# Patient Record
Sex: Male | Born: 2000 | Hispanic: No | Marital: Single | Smoking: Never smoker
Health system: Southern US, Community
[De-identification: ages and names within clinical notes are randomized; demographics above are authoritative.]

## PROBLEM LIST (undated history)

## (undated) ENCOUNTER — Emergency Department (HOSPITAL_COMMUNITY): Discharge status: Against Medical Advice | Payer: Medicaid Other

---
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
## 2022-06-05 ENCOUNTER — Ambulatory Visit (HOSPITAL_COMMUNITY)
Admission: EM | Admit: 2022-06-05 | Discharge: 2022-06-05 | Disposition: A | Discharge status: Home / Self Care | Payer: Medicaid Other | Attending: Family Medicine | Admitting: Family Medicine

## 2022-06-05 ENCOUNTER — Encounter (HOSPITAL_COMMUNITY): Payer: Self-pay

## 2022-06-05 DIAGNOSIS — R0600 Dyspnea, unspecified: Secondary | ICD-10-CM | POA: Diagnosis not present

## 2022-06-05 DIAGNOSIS — R110 Nausea: Secondary | ICD-10-CM | POA: Diagnosis not present

## 2022-06-05 MED ORDER — ONDANSETRON 4 MG PO TBDP: 4.0000 mg | ORAL_TABLET | Freq: Three times a day (TID) | ORAL | 0 refills | Status: DC | PRN

## 2022-06-05 NOTE — ED Triage Notes (Signed)
Pt feels nausea, SOB, since today

## 2022-06-05 NOTE — Discharge Instructions (Signed)
Ondansetron dissolved in the mouth every 8 hours as needed for nausea or vomiting. Clear liquids and bland things to eat.  You can use the QR code/website at the back of the summary paperwork to schedule yourself a new patient appointment with primary care

## 2022-06-05 NOTE — ED Provider Notes (Signed)
[facility]    CSN: [id number] Arrival date & time: 06/05/22  1634      History   Chief Complaint Chief Complaint  Patient presents with   Nausea    HPI Vernon Gray is a 21 y.o. male.   HPI Here for nausea that began at 1 this afternoon.  He has had no vomiting or diarrhea today.  His last normal bowel movement was this morning.  No blood  No fever or chills or upper respiratory symptoms.  He states he is also short of breath.  He states he has been short of breath since he was 17, that means for years.  It does come and go  He does not have a primary care provider  History reviewed. No pertinent past medical history.  There are no problems to display for this patient.   Past Surgical History:  Procedure Laterality Date   ADENOIDECTOMY Bilateral 01/16/2013   TONSILLECTOMY Bilateral 01/16/2013   TONSILLECTOMY AND ADENOIDECTOMY Bilateral 01/16/2013   Procedure: TONSILLECTOMY AND ADENOIDECTOMY;  Surgeon: [name], MD;  Location: [facility] OR;  Service: ENT;  Laterality: Bilateral;       Home Medications    Prior to Admission medications   Medication Sig Start Date End Date Taking? Authorizing Provider  ondansetron (ZOFRAN-ODT) 4 MG disintegrating tablet Take 1 tablet (4 mg total) by mouth every 8 (eight) hours as needed for nausea or vomiting. 06/05/22  Yes Zenia Resides, MD  cetirizine HCl (ZYRTEC) 5 MG/5ML SYRP Take 5 mLs (5 mg total) by mouth daily. 02/06/14   Ozella Rocks, MD  fluticasone (FLONASE) 50 MCG/ACT nasal spray Place 1 spray into both nostrils daily. 02/06/14   Ozella Rocks, MD  Olopatadine HCl 0.2 % SOLN Apply 1 drop to eye daily. 02/06/14   Ozella Rocks, MD    Family History Family History  Problem Relation Age of Onset   Hypertension Paternal Grandmother     Social History Social History   Tobacco Use   Smoking status: Never  Substance Use Topics   Alcohol use: No   Drug use: No     Allergies    Patient has no known allergies.   Review of Systems Review of Systems   Physical Exam Triage Vital Signs ED Triage Vitals  Enc Vitals Group     BP 06/05/22 1759 102/60     Pulse Rate 06/05/22 1759 72     Resp 06/05/22 1759 16     Temp 06/05/22 1759 98.8 F (37.1 C)     Temp Source 06/05/22 1759 Oral     SpO2 06/05/22 1759 99 %     Weight --      Height --      Head Circumference --      Peak Flow --      Pain Score 06/05/22 1803 0     Pain Loc --      Pain Edu? --      Excl. in GC? --    No data found.  Updated Vital Signs BP 102/60   Pulse 72   Temp 98.8 F (37.1 C) (Oral)   Resp 16   SpO2 99%   Visual Acuity Right Eye Distance:   Left Eye Distance:   Bilateral Distance:    Right Eye Near:   Left Eye Near:    Bilateral Near:     Physical Exam Vitals reviewed.  Constitutional:      General: He is not in acute  distress.    Appearance: He is not toxic-appearing.  HENT:     Right Ear: Tympanic membrane and ear canal normal.     Left Ear: Tympanic membrane and ear canal normal.     Nose: Nose normal.     Mouth/Throat:     Mouth: Mucous membranes are moist.     Pharynx: No oropharyngeal exudate or posterior oropharyngeal erythema.  Eyes:     Extraocular Movements: Extraocular movements intact.     Conjunctiva/sclera: Conjunctivae normal.     Pupils: Pupils are equal, round, and reactive to light.  Cardiovascular:     Rate and Rhythm: Normal rate and regular rhythm.     Heart sounds: No murmur heard. Pulmonary:     Effort: Pulmonary effort is normal. No respiratory distress.     Breath sounds: Normal breath sounds. No stridor. No wheezing, rhonchi or rales.  Abdominal:     Palpations: Abdomen is soft.     Tenderness: There is no abdominal tenderness.  Musculoskeletal:     Cervical back: Neck supple.  Lymphadenopathy:     Cervical: No cervical adenopathy.  Skin:    Capillary Refill: Capillary refill takes less than 2 seconds.     Coloration:  Skin is not jaundiced or pale.  Neurological:     General: No focal deficit present.     Mental Status: He is alert and oriented to person, place, and time.  Psychiatric:        Behavior: Behavior normal.      UC Treatments / Results  Labs (all labs ordered are listed, but only abnormal results are displayed) Labs Reviewed - No data to display  EKG   Radiology No results found.  Procedures Procedures (including critical care time)  Medications Ordered in UC Medications - No data to display  Initial Impression / Assessment and Plan / UC Course  I have reviewed the triage vital signs and the nursing notes.  Pertinent labs & imaging results that were available during my care of the patient were reviewed by me and considered in my medical decision making (see chart for details).        I feel the nausea is most likely due to a new viral gastroenteritis.  Zofran sent.  His lungs are clear and his vital signs are reassuring.  I have asked for assistance finding him in PCP for further evaluation of this intermittent chronic shortness of breath Final Clinical Impressions(s) / UC Diagnoses   Final diagnoses:  Nausea  Dyspnea, unspecified type     Discharge Instructions      Ondansetron dissolved in the mouth every 8 hours as needed for nausea or vomiting. Clear liquids and bland things to eat.  You can use the QR code/website at the back of the summary paperwork to schedule yourself a new patient appointment with primary care       ED Prescriptions     Medication Sig Dispense Auth. Provider   ondansetron (ZOFRAN-ODT) 4 MG disintegrating tablet Take 1 tablet (4 mg total) by mouth every 8 (eight) hours as needed for nausea or vomiting. 10 tablet Marlinda Mike Janace Aris, MD      PDMP not reviewed this encounter.   Zenia Resides, MD 06/05/22 [phone]

## 2023-03-01 ENCOUNTER — Ambulatory Visit (HOSPITAL_COMMUNITY)
Admission: EM | Admit: 2023-03-01 | Discharge: 2023-03-01 | Disposition: A | Discharge status: Home / Self Care | Payer: MEDICAID | Attending: Emergency Medicine | Admitting: Emergency Medicine

## 2023-03-01 ENCOUNTER — Encounter (HOSPITAL_COMMUNITY): Payer: Self-pay

## 2023-03-01 DIAGNOSIS — U071 COVID-19: Secondary | ICD-10-CM | POA: Diagnosis not present

## 2023-03-01 DIAGNOSIS — J029 Acute pharyngitis, unspecified: Secondary | ICD-10-CM

## 2023-03-01 DIAGNOSIS — J069 Acute upper respiratory infection, unspecified: Secondary | ICD-10-CM | POA: Diagnosis present

## 2023-03-01 DIAGNOSIS — R059 Cough, unspecified: Secondary | ICD-10-CM | POA: Insufficient documentation

## 2023-03-01 DIAGNOSIS — B9789 Other viral agents as the cause of diseases classified elsewhere: Secondary | ICD-10-CM | POA: Diagnosis not present

## 2023-03-01 LAB — POCT RAPID STREP A (OFFICE): Rapid Strep A Screen: NEGATIVE

## 2023-03-01 LAB — SARS CORONAVIRUS 2 (TAT 6-24 HRS): SARS Coronavirus 2: POSITIVE — AB

## 2023-03-01 MED ORDER — BENZONATATE 100 MG PO CAPS: 100.0000 mg | ORAL_CAPSULE | Freq: Three times a day (TID) | ORAL | 0 refills | Status: DC

## 2023-03-01 MED ORDER — IBUPROFEN 800 MG PO TABS: 800.0000 mg | ORAL_TABLET | Freq: Three times a day (TID) | ORAL | 0 refills | Status: DC

## 2023-03-01 MED ORDER — ACETAMINOPHEN 500 MG PO TABS: 500.0000 mg | ORAL_TABLET | Freq: Four times a day (QID) | ORAL | 0 refills | Status: DC | PRN

## 2023-03-01 NOTE — ED Triage Notes (Signed)
Pt c/o sore throat, headache, fever, and nasal congestion x4 days. Taking OTC with little relief.

## 2023-03-01 NOTE — ED Provider Notes (Signed)
[facility]    CSN: [id number] Arrival date & time: 03/01/23  1021      History   Chief Complaint Chief Complaint  Patient presents with   Sore Throat    HPI Vernon Gray is a 22 y.o. male.   Patient presents to clinic with sore throat, subjective fever, headache, nasal congestion and bodyaches that have been present for the past 4 days.  Has not measured his temperature with a thermometer.  He has been around someone with similar symptoms.  He denies any abdominal pain, nausea, vomiting or diarrhea.  Does have a dry cough with mucus.  Reports feeling congested when he takes a deep breath.  Denies any wheezing, chest pain or shortness of breath.  He has been taking Tylenol and Mucinex.  He has had his tonsils and adenoids removed.    The history is provided by the patient and medical records.  Sore Throat Pertinent negatives include no chest pain, no abdominal pain and no shortness of breath.    History reviewed. No pertinent past medical history.  There are no problems to display for this patient.   Past Surgical History:  Procedure Laterality Date   ADENOIDECTOMY Bilateral 01/16/2013   TONSILLECTOMY Bilateral 01/16/2013   TONSILLECTOMY AND ADENOIDECTOMY Bilateral 01/16/2013   Procedure: TONSILLECTOMY AND ADENOIDECTOMY;  Surgeon: [name], MD;  Location: [facility] OR;  Service: ENT;  Laterality: Bilateral;       Home Medications    Prior to Admission medications   Medication Sig Start Date End Date Taking? Authorizing Provider  acetaminophen (TYLENOL) 500 MG tablet Take 1 tablet (500 mg total) by mouth every 6 (six) hours as needed. 03/01/23  Yes Rinaldo Ratel, [place] N, FNP  benzonatate (TESSALON) 100 MG capsule Take 1 capsule (100 mg total) by mouth every 8 (eight) hours. 03/01/23  Yes Rinaldo Ratel, [place] N, FNP  ibuprofen (ADVIL) 800 MG tablet Take 1 tablet (800 mg total) by mouth 3 (three) times daily. 03/01/23  Yes Rinaldo Ratel, [place] N, FNP   cetirizine HCl (ZYRTEC) 5 MG/5ML SYRP Take 5 mLs (5 mg total) by mouth daily. 02/06/14   Ozella Rocks, MD  fluticasone (FLONASE) 50 MCG/ACT nasal spray Place 1 spray into both nostrils daily. 02/06/14   Ozella Rocks, MD  Olopatadine HCl 0.2 % SOLN Apply 1 drop to eye daily. 02/06/14   Ozella Rocks, MD  ondansetron (ZOFRAN-ODT) 4 MG disintegrating tablet Take 1 tablet (4 mg total) by mouth every 8 (eight) hours as needed for nausea or vomiting. 06/05/22   Zenia Resides, MD    Family History Family History  Problem Relation Age of Onset   Hypertension Paternal Grandmother     Social History Social History   Tobacco Use   Smoking status: Never  Vaping Use   Vaping status: Every Day  Substance Use Topics   Alcohol use: No   Drug use: Yes    Types: Marijuana     Allergies   Patient has no known allergies.   Review of Systems Review of Systems  Constitutional:  Positive for fever.  HENT:  Positive for congestion and sore throat.   Respiratory:  Positive for cough. Negative for shortness of breath and wheezing.   Cardiovascular:  Negative for chest pain.  Gastrointestinal:  Negative for abdominal pain, diarrhea, nausea and vomiting.     Physical Exam Triage Vital Signs ED Triage Vitals [03/01/23 1137]  Encounter Vitals Group     BP 128/74     Systolic  BP Percentile      Diastolic BP Percentile      Pulse Rate 71     Resp 18     Temp 98.6 F (37 C)     Temp Source Oral     SpO2 98 %     Weight      Height      Head Circumference      Peak Flow      Pain Score 7     Pain Loc      Pain Education      Exclude from Growth Chart    No data found.  Updated Vital Signs BP 128/74 (BP Location: Left Arm)   Pulse 71   Temp 98.6 F (37 C) (Oral)   Resp 18   SpO2 98%   Visual Acuity Right Eye Distance:   Left Eye Distance:   Bilateral Distance:    Right Eye Near:   Left Eye Near:    Bilateral Near:     Physical Exam Vitals and nursing note  reviewed.  Constitutional:      Appearance: Normal appearance. He is well-developed.  HENT:     Head: Normocephalic and atraumatic.     Right Ear: External ear normal.     Left Ear: External ear normal.     Nose: Congestion and rhinorrhea present.     Mouth/Throat:     Mouth: Mucous membranes are moist.     Pharynx: Uvula midline. Posterior oropharyngeal erythema present.     Tonsils: No tonsillar exudate or tonsillar abscesses.  Eyes:     Conjunctiva/sclera: Conjunctivae normal.  Cardiovascular:     Rate and Rhythm: Normal rate and regular rhythm.     Heart sounds: Normal heart sounds. No murmur heard. Pulmonary:     Effort: Pulmonary effort is normal. No respiratory distress.     Breath sounds: Normal breath sounds.  Musculoskeletal:     Cervical back: Normal range of motion.  Skin:    General: Skin is warm and dry.  Neurological:     General: No focal deficit present.     Mental Status: He is alert and oriented to person, place, and time.  Psychiatric:        Mood and Affect: Mood normal.        Behavior: Behavior normal. Behavior is cooperative.      UC Treatments / Results  Labs (all labs ordered are listed, but only abnormal results are displayed) Labs Reviewed  SARS CORONAVIRUS 2 (TAT 6-24 HRS)  CULTURE, GROUP A STREP [facility])  POCT RAPID STREP A (OFFICE)    EKG   Radiology No results found.  Procedures Procedures (including critical care time)  Medications Ordered in UC Medications - No data to display  Initial Impression / Assessment and Plan / UC Course  I have reviewed the triage vital signs and the nursing notes.  Pertinent labs & imaging results that were available during my care of the patient were reviewed by me and considered in my medical decision making (see chart for details).  Vitals and triage reviewed, patient is hemodynamically stable.  Posterior pharynx with erythema, lungs are vesicular posteriorly. No tonsils. Rapid strep in clinic  negative, will send for culture.  Lower suspicion for strep at this time. Covid-19 testing completed, staff to contact if positive. Symptomatic management.  Plan of care, follow-up care return precautions given, no questions at this time.  Work note provided.    Final Clinical Impressions(s) / UC Diagnoses  Final diagnoses:  Viral URI with cough  Pharyngitis, unspecified etiology     Discharge Instructions      Your rapid strep testing was negative in clinic, we are sending this off for culture to ensure you do not need antibiotics.  We have also tested you for COVID-19 and we will contact you if the results are positive.  In the meantime, please continue symptomatic management.  You can alternate between Tylenol and ibuprofen every 4-6 hours for body aches, fever and discomfort.  The Tessalon Perles can help suppress her cough.  For your sore throat you can do warm saline gargles, tea with honey and sleep with a humidifier.  Your symptoms should improve over the next 3 to 5 days.  Please return to clinic for new or urgent symptoms.      ED Prescriptions     Medication Sig Dispense Auth. Provider   benzonatate (TESSALON) 100 MG capsule Take 1 capsule (100 mg total) by mouth every 8 (eight) hours. 21 capsule Rinaldo Ratel, [place] N, [place]   acetaminophen (TYLENOL) 500 MG tablet Take 1 tablet (500 mg total) by mouth every 6 (six) hours as needed. 30 tablet Rinaldo Ratel, [place] N, [place]   ibuprofen (ADVIL) 800 MG tablet Take 1 tablet (800 mg total) by mouth 3 (three) times daily. 21 tablet Christabell Loseke, [place] N, [place]      PDMP not reviewed this encounter.   Kymberly Blomberg, [place] N, [place] 03/01/23 1213

## 2023-03-01 NOTE — Discharge Instructions (Addendum)
Your rapid strep testing was negative in clinic, we are sending this off for culture to ensure you do not need antibiotics.  We have also tested you for COVID-19 and we will contact you if the results are positive.  In the meantime, please continue symptomatic management.  You can alternate between Tylenol and ibuprofen every 4-6 hours for body aches, fever and discomfort.  The Tessalon Perles can help suppress her cough.  For your sore throat you can do warm saline gargles, tea with honey and sleep with a humidifier.  Your symptoms should improve over the next 3 to 5 days.  Please return to clinic for new or urgent symptoms.

## 2023-03-03 LAB — CULTURE, GROUP A STREP (THRC)

## 2023-07-06 ENCOUNTER — Encounter: Payer: Self-pay | Admitting: Emergency Medicine

## 2023-07-06 ENCOUNTER — Ambulatory Visit
Admission: EM | Admit: 2023-07-06 | Discharge: 2023-07-06 | Disposition: A | Discharge status: Home / Self Care | Payer: MEDICAID | Attending: Emergency Medicine | Admitting: Emergency Medicine

## 2023-07-06 DIAGNOSIS — B351 Tinea unguium: Secondary | ICD-10-CM

## 2023-07-06 LAB — COMPREHENSIVE METABOLIC PANEL
ALT: 17 U/L (ref 0–44)
AST: 24 U/L (ref 15–41)
Albumin: 4.7 g/dL (ref 3.5–5.0)
Alkaline Phosphatase: 66 U/L (ref 38–126)
Anion gap: 7 (ref 5–15)
BUN: 25 mg/dL — ABNORMAL HIGH (ref 6–20)
CO2: 25 mmol/L (ref 22–32)
Calcium: 9 mg/dL (ref 8.9–10.3)
Chloride: 105 mmol/L (ref 98–111)
Creatinine, Ser: 0.99 mg/dL (ref 0.61–1.24)
GFR, Estimated: >60 mL/min (ref >60)
Glucose, Bld: 95 mg/dL (ref 70–99)
Potassium: 3.8 mmol/L (ref 3.5–5.1)
Sodium: 137 mmol/L (ref 135–145)
Total Bilirubin: 0.8 mg/dL (ref <1.2)
Total Protein: 7.8 g/dL (ref 6.5–8.1)

## 2023-07-06 MED ORDER — TERBINAFINE HCL 250 MG PO TABS: 250.0000 mg | ORAL_TABLET | Freq: Every day | ORAL | 2 refills | Status: DC

## 2023-07-06 NOTE — Discharge Instructions (Addendum)
Take the terbinafine once daily for treatment of your fungal toenail infection.  I am also going to refer you to podiatry so that they can monitor the therapy.  You may use over-the-counter Tylenol and/or ibuprofen according the package instructions as needed for any pain or discomfort.

## 2023-07-06 NOTE — ED Triage Notes (Signed)
Pt presents with right big toe pain x 6 months.

## 2023-07-06 NOTE — ED Provider Notes (Signed)
[facility]    CSN: [id number] Arrival date & time: 07/06/23  1724      History   Chief Complaint Chief Complaint  Patient presents with   Toe Pain    HPI Cavell Cancel is a 22 y.o. male.   HPI  22 year old male with no significant past medical history presents for evaluation of pain in his right big toe this been going on for the last 6 months.  He reports that the pain is in the toenail and that it is more painful when it is cold.  He denies any fever or drainage.  Patient does not have a primary care provider.  History reviewed. No pertinent past medical history.  There are no active problems to display for this patient.   Past Surgical History:  Procedure Laterality Date   ADENOIDECTOMY Bilateral 01/16/2013   TONSILLECTOMY Bilateral 01/16/2013   TONSILLECTOMY AND ADENOIDECTOMY Bilateral 01/16/2013   Procedure: TONSILLECTOMY AND ADENOIDECTOMY;  Surgeon: [name], MD;  Location: [facility] OR;  Service: ENT;  Laterality: Bilateral;       Home Medications    Prior to Admission medications   Medication Sig Start Date End Date Taking? Authorizing Provider  terbinafine (LAMISIL) 250 MG tablet Take 1 tablet (250 mg total) by mouth daily. 07/06/23  Yes Becky Augusta, NP  acetaminophen (TYLENOL) 500 MG tablet Take 1 tablet (500 mg total) by mouth every 6 (six) hours as needed. 03/01/23   Garrison, [place] N, FNP  benzonatate (TESSALON) 100 MG capsule Take 1 capsule (100 mg total) by mouth every 8 (eight) hours. 03/01/23   Garrison, [place] N, FNP  cetirizine HCl (ZYRTEC) 5 MG/5ML SYRP Take 5 mLs (5 mg total) by mouth daily. 02/06/14   Ozella Rocks, MD  fluticasone (FLONASE) 50 MCG/ACT nasal spray Place 1 spray into both nostrils daily. 02/06/14   Ozella Rocks, MD  ibuprofen (ADVIL) 800 MG tablet Take 1 tablet (800 mg total) by mouth 3 (three) times daily. 03/01/23   Garrison, [place] N, FNP  Olopatadine HCl 0.2 % SOLN Apply 1 drop to eye daily.  02/06/14   Ozella Rocks, MD  ondansetron (ZOFRAN-ODT) 4 MG disintegrating tablet Take 1 tablet (4 mg total) by mouth every 8 (eight) hours as needed for nausea or vomiting. 06/05/22   Zenia Resides, MD    Family History Family History  Problem Relation Age of Onset   Hypertension Paternal Grandmother     Social History Social History   Tobacco Use   Smoking status: Never   Smokeless tobacco: Never  Vaping Use   Vaping status: Every Day  Substance Use Topics   Alcohol use: No   Drug use: Yes    Types: Marijuana     Allergies   Patient has no known allergies.   Review of Systems Review of Systems  Musculoskeletal:        Right great toe pain     Physical Exam Triage Vital Signs ED Triage Vitals  Encounter Vitals Group     BP 07/06/23 1827 119/89     Systolic BP Percentile --      Diastolic BP Percentile --      Pulse Rate 07/06/23 1827 66     Resp 07/06/23 1827 16     Temp 07/06/23 1827 98.2 F (36.8 C)     Temp Source 07/06/23 1827 Oral     SpO2 07/06/23 1827 99 %     Weight --      Height --  Head Circumference --      Peak Flow --      Pain Score 07/06/23 1826 8     Pain Loc --      Pain Education --      Exclude from Growth Chart --    No data found.  Updated Vital Signs BP 119/89 (BP Location: Left Arm)   Pulse 66   Temp 98.2 F (36.8 C) (Oral)   Resp 16   SpO2 99%   Visual Acuity Right Eye Distance:   Left Eye Distance:   Bilateral Distance:    Right Eye Near:   Left Eye Near:    Bilateral Near:     Physical Exam Vitals and nursing note reviewed.  Constitutional:      Appearance: Normal appearance. He is not ill-appearing.  HENT:     Head: Normocephalic and atraumatic.  Musculoskeletal:        General: Tenderness present. No signs of injury.  Skin:    General: Skin is warm and dry.     Capillary Refill: Capillary refill takes less than 2 seconds.     Findings: No erythema.  Neurological:     General: No focal  deficit present.     Mental Status: He is alert and oriented to person, place, and time.      UC Treatments / Results  Labs (all labs ordered are listed, but only abnormal results are displayed) Labs Reviewed  COMPREHENSIVE METABOLIC PANEL - Abnormal; Notable for the following components:      Result Value   BUN 25 (*)    All other components within normal limits    EKG   Radiology No results found.  Procedures Procedures (including critical care time)  Medications Ordered in UC Medications - No data to display  Initial Impression / Assessment and Plan / UC Course  I have reviewed the triage vital signs and the nursing notes.  Pertinent labs & imaging results that were available during my care of the patient were reviewed by me and considered in my medical decision making (see chart for details).   Patient is a pleasant, nontoxic appearing 22 year old male presenting for evaluation of pain in the right great toenail that is been going on for the least 6 months.  As you can see in image above, the great toenail is yellowed and thick.  It is also extremely short.  The patient reports that he tried trimming it back.  There is no surrounding erythema to the cuticle to suggest paronychia.  His exam is consistent with onychomycosis.  I will order a CMP to assess patient's liver enzymes as he does not have any blood work on file before starting him on systemic antifungals.  I will also refer him to podiatry for ongoing treatment.  CMP shows normal liver enzymes.  I will discharge patient home on terbinafine 250 mg daily for 12 weeks for treatment of his onychomycosis.  I will also refer him to podiatry for continued monitoring of therapy.  Final Clinical Impressions(s) / UC Diagnoses   Final diagnoses:  Onychomycosis of right great toe     Discharge Instructions      Take the terbinafine once daily for treatment of your fungal toenail infection.  I am also going to refer  you to podiatry so that they can monitor the therapy.  You may use over-the-counter Tylenol and/or ibuprofen according the package instructions as needed for any pain or discomfort.     ED Prescriptions  Medication Sig Dispense Auth. Provider   terbinafine (LAMISIL) 250 MG tablet Take 1 tablet (250 mg total) by mouth daily. 30 tablet Becky Augusta, NP      PDMP not reviewed this encounter.   Becky Augusta, NP 07/06/23 [phone]

## 2023-07-19 DEATH — deceased
